# Patient Record
Sex: Female | Born: 1965
Health system: Southern US, Community
[De-identification: ages and names within clinical notes are randomized; demographics above are authoritative.]

---

## 2020-11-14 DIAGNOSIS — Z90710 Acquired absence of both cervix and uterus: Secondary | ICD-10-CM | POA: Diagnosis not present

## 2020-11-14 DIAGNOSIS — L659 Nonscarring hair loss, unspecified: Secondary | ICD-10-CM | POA: Diagnosis not present

## 2020-11-14 DIAGNOSIS — Z9079 Acquired absence of other genital organ(s): Secondary | ICD-10-CM | POA: Diagnosis not present

## 2020-11-14 DIAGNOSIS — Z1231 Encounter for screening mammogram for malignant neoplasm of breast: Secondary | ICD-10-CM | POA: Diagnosis not present

## 2020-11-29 DIAGNOSIS — L648 Other androgenic alopecia: Secondary | ICD-10-CM | POA: Diagnosis not present

## 2020-12-05 ENCOUNTER — Encounter: Payer: Self-pay | Admitting: Obstetrics and Gynecology

## 2020-12-09 ENCOUNTER — Encounter: Payer: Self-pay | Admitting: Obstetrics and Gynecology

## 2021-01-21 ENCOUNTER — Other Ambulatory Visit: Payer: Self-pay | Admitting: Obstetrics and Gynecology

## 2021-01-21 DIAGNOSIS — Z1231 Encounter for screening mammogram for malignant neoplasm of breast: Secondary | ICD-10-CM | POA: Diagnosis not present

## 2021-01-21 DIAGNOSIS — Z1272 Encounter for screening for malignant neoplasm of vagina: Secondary | ICD-10-CM | POA: Diagnosis not present

## 2021-01-21 DIAGNOSIS — Z1211 Encounter for screening for malignant neoplasm of colon: Secondary | ICD-10-CM | POA: Diagnosis not present

## 2021-01-21 DIAGNOSIS — Z01419 Encounter for gynecological examination (general) (routine) without abnormal findings: Secondary | ICD-10-CM | POA: Diagnosis not present

## 2021-01-24 DIAGNOSIS — Z01419 Encounter for gynecological examination (general) (routine) without abnormal findings: Secondary | ICD-10-CM | POA: Diagnosis not present

## 2021-02-13 ENCOUNTER — Ambulatory Visit
Admission: RE | Admit: 2021-02-13 | Discharge: 2021-02-13 | Disposition: A | Discharge status: Home / Self Care | Payer: BC Managed Care – PPO | Source: Ambulatory Visit | Attending: Obstetrics and Gynecology | Admitting: Obstetrics and Gynecology

## 2021-02-13 ENCOUNTER — Other Ambulatory Visit: Payer: Self-pay

## 2021-02-13 ENCOUNTER — Other Ambulatory Visit: Payer: Self-pay | Admitting: Obstetrics and Gynecology

## 2021-02-13 DIAGNOSIS — Z1231 Encounter for screening mammogram for malignant neoplasm of breast: Secondary | ICD-10-CM | POA: Insufficient documentation

## 2021-02-13 DIAGNOSIS — N644 Mastodynia: Secondary | ICD-10-CM

## 2021-03-03 ENCOUNTER — Other Ambulatory Visit: Payer: Self-pay

## 2021-03-03 ENCOUNTER — Ambulatory Visit
Admission: RE | Admit: 2021-03-03 | Discharge: 2021-03-03 | Disposition: A | Discharge status: Home / Self Care | Payer: BC Managed Care – PPO | Source: Ambulatory Visit | Attending: Obstetrics and Gynecology | Admitting: Obstetrics and Gynecology

## 2021-03-03 DIAGNOSIS — N644 Mastodynia: Secondary | ICD-10-CM | POA: Diagnosis not present

## 2021-03-03 DIAGNOSIS — R922 Inconclusive mammogram: Secondary | ICD-10-CM | POA: Diagnosis not present

## 2021-04-04 DIAGNOSIS — J0100 Acute maxillary sinusitis, unspecified: Secondary | ICD-10-CM | POA: Diagnosis not present

## 2021-07-09 DIAGNOSIS — J029 Acute pharyngitis, unspecified: Secondary | ICD-10-CM | POA: Diagnosis not present

## 2021-07-09 DIAGNOSIS — J209 Acute bronchitis, unspecified: Secondary | ICD-10-CM | POA: Diagnosis not present

## 2021-07-09 DIAGNOSIS — H66001 Acute suppurative otitis media without spontaneous rupture of ear drum, right ear: Secondary | ICD-10-CM | POA: Diagnosis not present

## 2021-07-09 DIAGNOSIS — Z03818 Encounter for observation for suspected exposure to other biological agents ruled out: Secondary | ICD-10-CM | POA: Diagnosis not present

## 2021-07-09 DIAGNOSIS — J019 Acute sinusitis, unspecified: Secondary | ICD-10-CM | POA: Diagnosis not present

## 2021-07-09 DIAGNOSIS — J0140 Acute pansinusitis, unspecified: Secondary | ICD-10-CM | POA: Diagnosis not present

## 2021-07-09 DIAGNOSIS — R112 Nausea with vomiting, unspecified: Secondary | ICD-10-CM | POA: Diagnosis not present

## 2021-07-09 DIAGNOSIS — Z209 Contact with and (suspected) exposure to unspecified communicable disease: Secondary | ICD-10-CM | POA: Diagnosis not present

## 2021-10-29 DIAGNOSIS — J0100 Acute maxillary sinusitis, unspecified: Secondary | ICD-10-CM | POA: Diagnosis not present

## 2021-10-29 DIAGNOSIS — R1011 Right upper quadrant pain: Secondary | ICD-10-CM | POA: Diagnosis not present

## 2022-04-27 ENCOUNTER — Telehealth: Payer: BC Managed Care – PPO | Admitting: Nurse Practitioner

## 2022-04-27 DIAGNOSIS — J40 Bronchitis, not specified as acute or chronic: Secondary | ICD-10-CM

## 2022-04-27 DIAGNOSIS — H1033 Unspecified acute conjunctivitis, bilateral: Secondary | ICD-10-CM

## 2022-04-27 MED ORDER — DOXYCYCLINE HYCLATE 100 MG PO TABS: 100.0000 mg | ORAL_TABLET | Freq: Two times a day (BID) | ORAL | 0 refills | Status: AC

## 2022-04-27 MED ORDER — ALBUTEROL SULFATE HFA 108 (90 BASE) MCG/ACT IN AERS: 2.0000 | INHALATION_SPRAY | Freq: Four times a day (QID) | RESPIRATORY_TRACT | 0 refills | Status: AC | PRN

## 2022-04-27 MED ORDER — POLYMYXIN B-TRIMETHOPRIM 10000-0.1 UNIT/ML-% OP SOLN: 1.0000 [drp] | OPHTHALMIC | 0 refills | Status: AC

## 2022-04-27 NOTE — Progress Notes (Signed)
We are sorry that you are not feeling well.  Here is how we plan to help!  Based on your presentation I believe you most likely have A cough due to bacteria.  When patients have a fever and a productive cough with a change in color or increased sputum production, we are concerned about bacterial bronchitis.  If left untreated it can progress to pneumonia.  If your symptoms do not improve with your treatment plan it is important that you contact your provider.   I have prescribed Doxycycline 100 mg twice a day for 10 days  This medication would also treat a sinus infection.    In addition you may use A non-prescription cough medication called Mucinex: take 2 tablets every 12 hours.  We will also send in a prescription for an inhaler to help with your cough.   Meds ordered this encounter  Medications   doxycycline (VIBRA-TABS) 100 MG tablet    Sig: Take 1 tablet (100 mg total) by mouth 2 (two) times daily for 10 days.    Dispense:  20 tablet    Refill:  0   albuterol (VENTOLIN HFA) 108 (90 Base) MCG/ACT inhaler    Sig: Inhale 2 puffs into the lungs every 6 (six) hours as needed for wheezing or shortness of breath.    Dispense:  8 g    Refill:  0     From your responses in the eVisit questionnaire you describe inflammation in the upper respiratory tract which is causing a significant cough.  This is commonly called Bronchitis and has four common causes:   Allergies Viral Infections Acid Reflux Bacterial Infection Allergies, viruses and acid reflux are treated by controlling symptoms or eliminating the cause. An example might be a cough caused by taking certain blood pressure medications. You stop the cough by changing the medication. Another example might be a cough caused by acid reflux. Controlling the reflux helps control the cough.  USE OF BRONCHODILATOR ("RESCUE") INHALERS: There is a risk from using your bronchodilator too frequently.  The risk is that over-reliance on a medication  which only relaxes the muscles surrounding the breathing tubes can reduce the effectiveness of medications prescribed to reduce swelling and congestion of the tubes themselves.  Although you feel brief relief from the bronchodilator inhaler, your asthma may actually be worsening with the tubes becoming more swollen and filled with mucus.  This can delay other crucial treatments, such as oral steroid medications. If you need to use a bronchodilator inhaler daily, several times per day, you should discuss this with your provider.  There are probably better treatments that could be used to keep your asthma under control.     HOME CARE Only take medications as instructed by your medical team. Complete the entire course of an antibiotic. Drink plenty of fluids and get plenty of rest. Avoid close contacts especially the very young and the elderly Cover your mouth if you cough or cough into your sleeve. Always remember to wash your hands A steam or ultrasonic humidifier can help congestion.   GET HELP RIGHT AWAY IF: You develop worsening fever. You become short of breath You cough up blood. Your symptoms persist after you have completed your treatment plan MAKE SURE YOU  Understand these instructions. Will watch your condition. Will get help right away if you are not doing well or get worse.    Thank you for choosing an e-visit.  Your e-visit answers were reviewed by a board certified advanced  clinical practitioner to complete your personal care plan. Depending upon the condition, your plan could have included both over the counter or prescription medications.  Please review your pharmacy choice. Make sure the pharmacy is open so you can pick up prescription now. If there is a problem, you may contact your provider through [employer] and have the prescription routed to another pharmacy.  Your safety is important to [place]. If you have drug allergies check your prescription carefully.   For  the next 24 hours you can use MyChart to ask questions about today's visit, request a non-urgent call back, or ask for a work or school excuse. You will get an email in the next two days asking about your experience. I hope that your e-visit has been valuable and will speed your recovery.  I spent approximately 5 minutes reviewing the patient's history, current symptoms and coordinating their care today.

## 2022-04-27 NOTE — Addendum Note (Signed)
Addended by: Madilyn Hook on: 04/27/2022 08:48 AM   Modules accepted: Orders

## 2022-04-27 NOTE — Progress Notes (Signed)
Sharee Pimple,  No problem here are the instructions for pink eye:  Based on what you have shared with me it looks like you have conjunctivitis.  Conjunctivitis is a common inflammatory or infectious condition of the eye that is often referred to as "pink eye".  In most cases it is contagious (viral or bacterial). However, not all conjunctivitis requires antibiotics (ex. Allergic).  We have made appropriate suggestions for you based upon your presentation.  I have prescribed Polytrim Ophthalmic drops 1-2 drops 4 times a day times 5 days  Pink eye can be highly contagious.  It is typically spread through direct contact with secretions, or contaminated objects or surfaces that one may have touched.  Strict handwashing is suggested with soap and water is urged.  If not available, use alcohol based had sanitizer.  Avoid unnecessary touching of the eye.  If you wear contact lenses, you will need to refrain from wearing them until you see no white discharge from the eye for at least 24 hours after being on medication.  You should see symptom improvement in 1-2 days after starting the medication regimen.  Call us if symptoms are not improved in 1-2 days.  Home Care: Wash your hands often! Do not wear your contacts until you complete your treatment plan. Avoid sharing towels, bed linen, personal items with a person who has pink eye. See attention for anyone in your home with similar symptoms.  Get Help Right Away If: Your symptoms do not improve. You develop blurred or loss of vision. Your symptoms worsen (increased discharge, pain or redness)

## 2022-05-19 ENCOUNTER — Other Ambulatory Visit: Payer: Self-pay | Admitting: Nurse Practitioner

## 2022-05-19 DIAGNOSIS — J40 Bronchitis, not specified as acute or chronic: Secondary | ICD-10-CM

## 2022-05-26 DIAGNOSIS — Z01419 Encounter for gynecological examination (general) (routine) without abnormal findings: Secondary | ICD-10-CM | POA: Diagnosis not present

## 2022-05-26 DIAGNOSIS — Z1211 Encounter for screening for malignant neoplasm of colon: Secondary | ICD-10-CM | POA: Diagnosis not present

## 2022-05-26 DIAGNOSIS — Z13220 Encounter for screening for lipoid disorders: Secondary | ICD-10-CM | POA: Diagnosis not present

## 2022-05-26 DIAGNOSIS — Z1231 Encounter for screening mammogram for malignant neoplasm of breast: Secondary | ICD-10-CM | POA: Diagnosis not present

## 2022-07-24 IMAGING — US US BREAST*L* LIMITED INC AXILLA
1 series · 6 of 6 positions shown · non-contrast
Comparison: Previous exam(s).

CLINICAL DATA: 54-year-old female with focal left breast pain for
3-4 months.

EXAM:
DIGITAL DIAGNOSTIC BILATERAL MAMMOGRAM WITH TOMOSYNTHESIS AND CAD;
ULTRASOUND LEFT BREAST LIMITED
TECHNIQUE: Bilateral digital diagnostic mammography and breast tomosynthesis
was performed. The images were evaluated with computer-aided
detection.; Targeted ultrasound examination of the left breast was
performed.

[Series 1: us breast*left* limited inc axilla · 0.08mm/px · 6 of 6 slices shown]
[im 1/6]
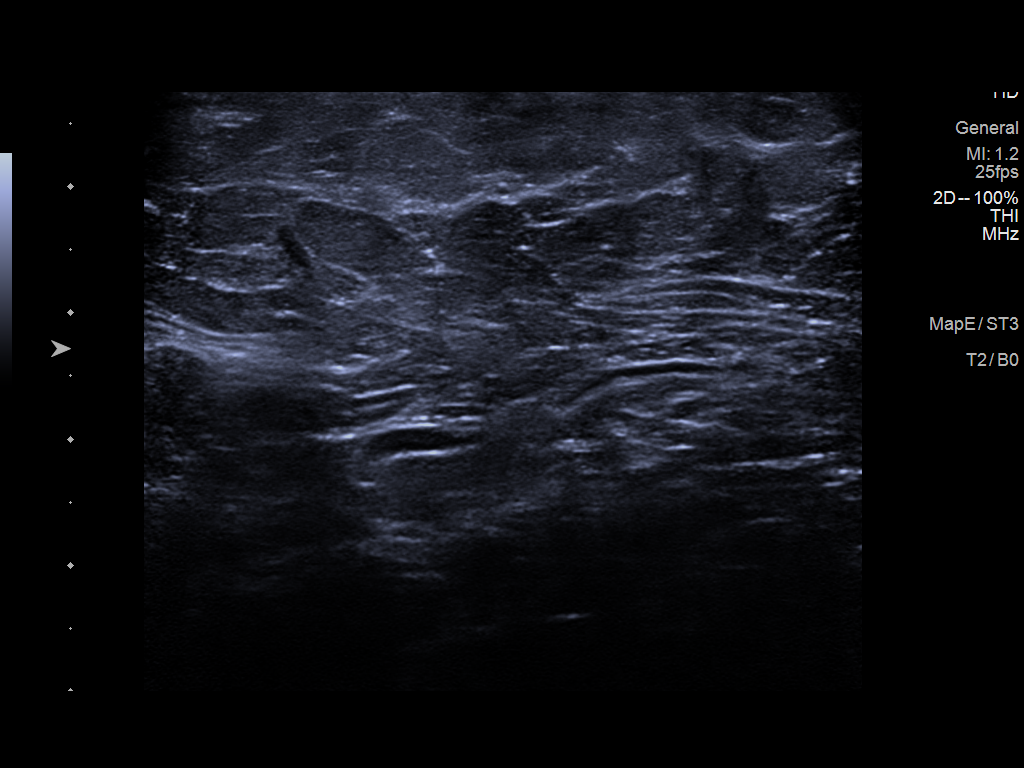
[im 2/6]
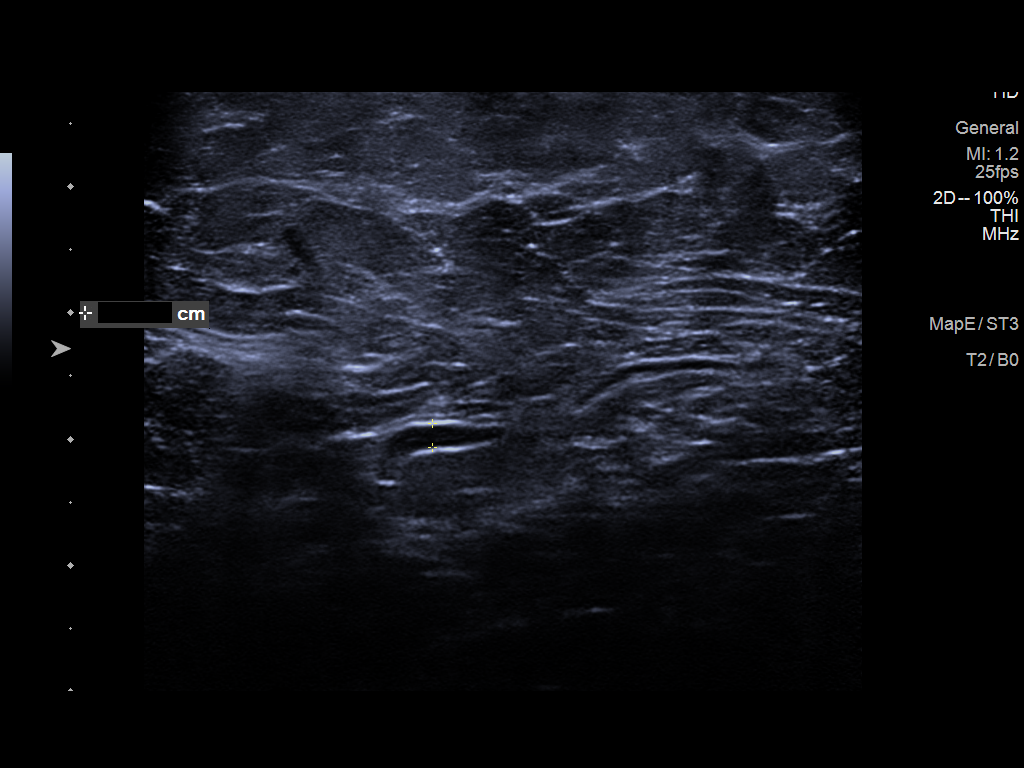
[im 3/6]
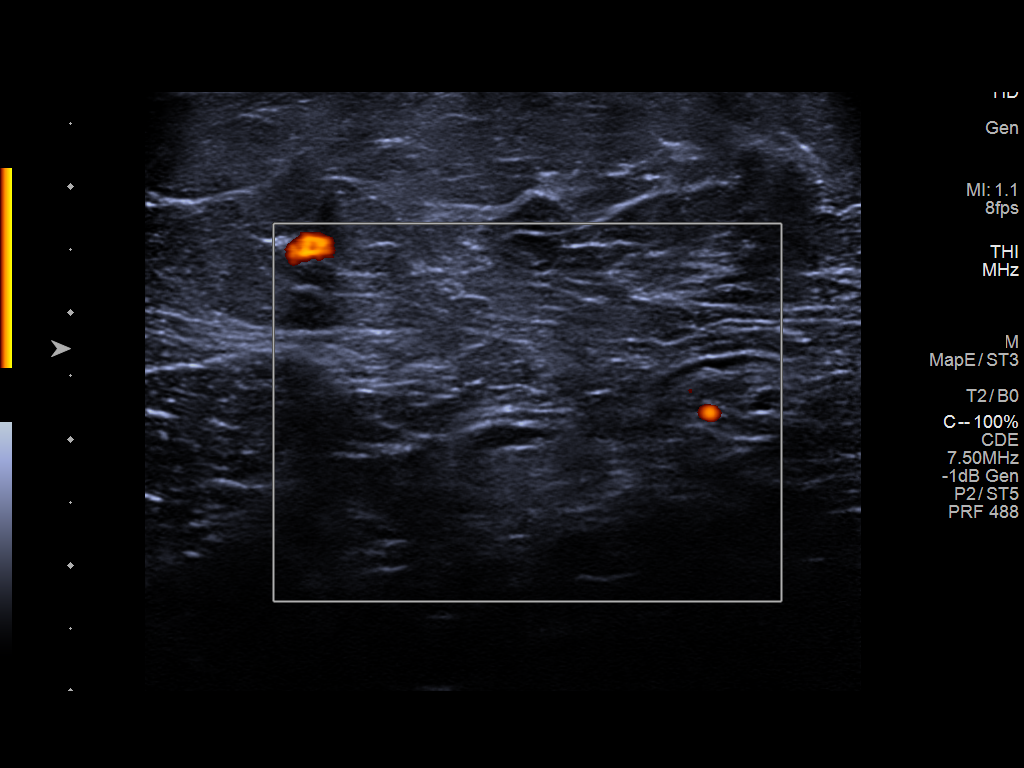
[im 4/6]
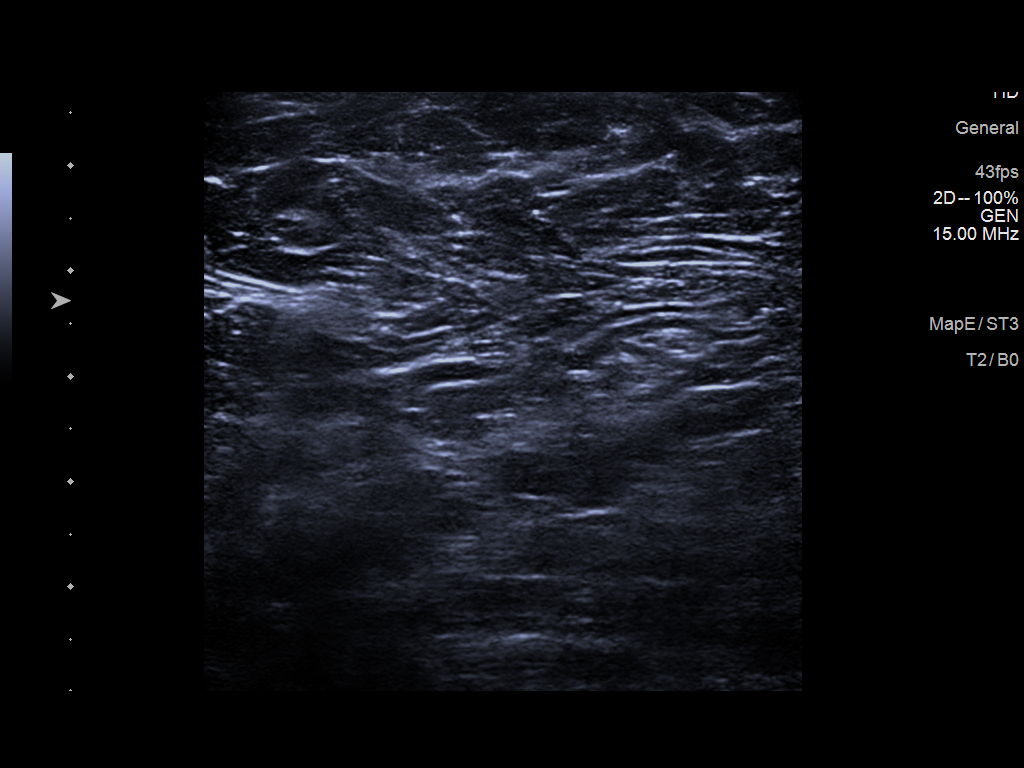
[im 5/6]
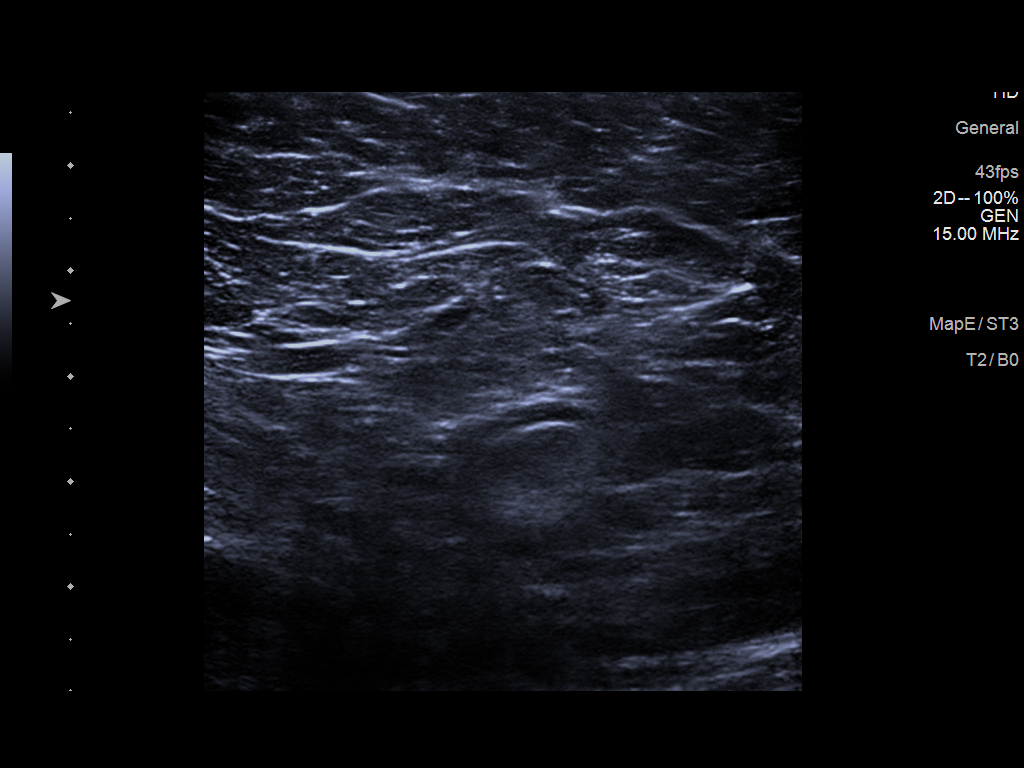
[im 6/6]
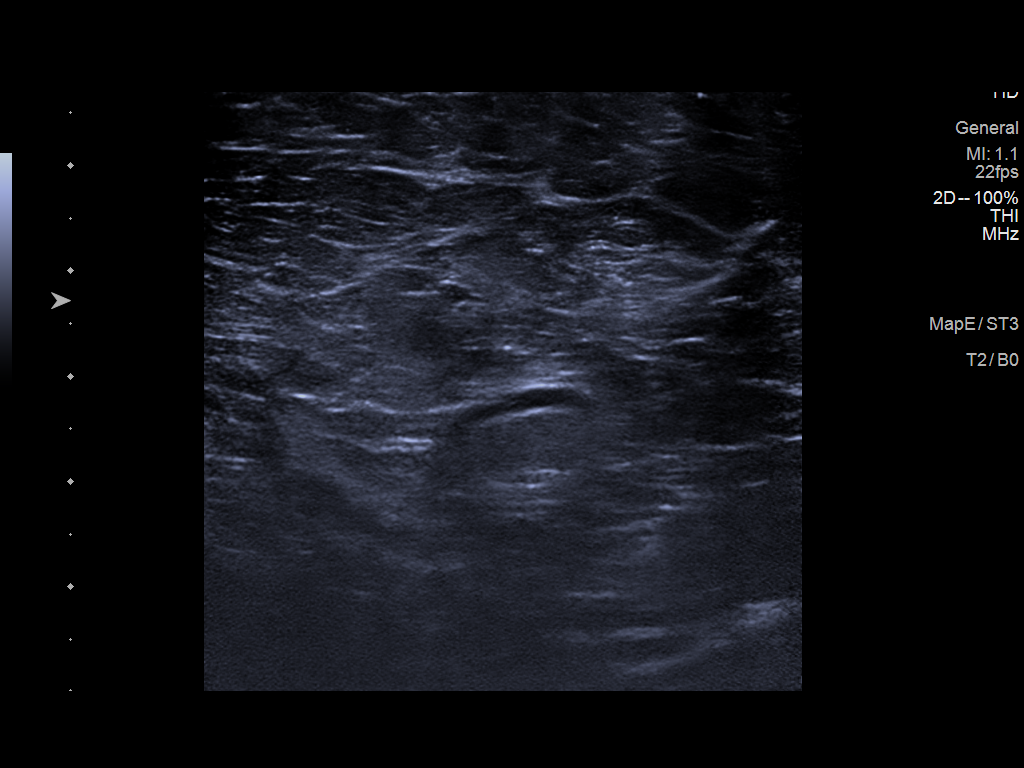

[6 of 6 positions shown; findings below may reference images not displayed]

ACR Breast Density Category b: There are scattered areas of
fibroglandular density.
FINDINGS: A radiopaque BB was placed at the site of the patient's focal pain
in the far upper outer left breast. No focal or suspicious
mammographic findings are seen deep to the radiopaque BB or within
the remainder of either breast. The parenchymal pattern is stable.

Targeted ultrasound is performed, showing normal fibroglandular
tissue without focal or suspicious sonographic abnormality in the
upper-outer left breast/low axilla.
IMPRESSION: 1. No mammographic evidence of malignancy in either breast.
2. Unremarkable ultrasound evaluation of the upper outer left
breast.

RECOMMENDATION:
1. Clinical follow-up recommended for the focally painful area of
concern in the left breast. Any further workup should be based on
clinical grounds.
2.  Screening mammogram in one year.(Code:[id number])

I have discussed the findings and recommendations with the patient.
If applicable, a reminder letter will be sent to the patient
regarding the next appointment.

BI-RADS CATEGORY  1: Negative.

## 2022-09-13 ENCOUNTER — Other Ambulatory Visit: Payer: Self-pay

## 2022-09-13 ENCOUNTER — Emergency Department
Admission: EM | Admit: 2022-09-13 | Discharge: 2022-09-13 | Disposition: A | Discharge status: Home / Self Care | Payer: BC Managed Care – PPO | Attending: Emergency Medicine | Admitting: Emergency Medicine

## 2022-09-13 ENCOUNTER — Emergency Department: Payer: BC Managed Care – PPO

## 2022-09-13 DIAGNOSIS — R197 Diarrhea, unspecified: Secondary | ICD-10-CM | POA: Diagnosis not present

## 2022-09-13 DIAGNOSIS — R1031 Right lower quadrant pain: Secondary | ICD-10-CM

## 2022-09-13 DIAGNOSIS — R112 Nausea with vomiting, unspecified: Secondary | ICD-10-CM | POA: Insufficient documentation

## 2022-09-13 LAB — CBC WITH DIFFERENTIAL/PLATELET
Abs Immature Granulocytes: 0.02 10*3/uL (ref 0.00–0.07)
Basophils Absolute: 0.1 10*3/uL (ref 0.0–0.1)
Basophils Relative: 1 %
Eosinophils Absolute: 0.7 10*3/uL — ABNORMAL HIGH (ref 0.0–0.5)
Eosinophils Relative: 6 %
HCT: 47 % — ABNORMAL HIGH (ref 36.0–46.0)
Hemoglobin: 16 g/dL — ABNORMAL HIGH (ref 12.0–15.0)
Immature Granulocytes: 0 %
Lymphocytes Relative: 33 %
Lymphs Abs: 3.5 10*3/uL (ref 0.7–4.0)
MCH: 31.4 pg (ref 26.0–34.0)
MCHC: 34 g/dL (ref 30.0–36.0)
MCV: 92.3 fL (ref 80.0–100.0)
Monocytes Absolute: 0.6 10*3/uL (ref 0.1–1.0)
Monocytes Relative: 6 %
Neutro Abs: 5.7 10*3/uL (ref 1.7–7.7)
Neutrophils Relative %: 54 %
Platelets: 372 10*3/uL (ref 150–400)
RBC: 5.09 MIL/uL (ref 3.87–5.11)
RDW: 11.5 % (ref 11.5–15.5)
WBC: 10.6 10*3/uL — ABNORMAL HIGH (ref 4.0–10.5)
nRBC: 0 % (ref 0.0–0.2)

## 2022-09-13 LAB — URINALYSIS, ROUTINE W REFLEX MICROSCOPIC
Bilirubin Urine: NEGATIVE
Glucose, UA: NEGATIVE mg/dL
Ketones, ur: NEGATIVE mg/dL
Leukocytes,Ua: NEGATIVE
Nitrite: NEGATIVE
Protein, ur: NEGATIVE mg/dL
Specific Gravity, Urine: 1.005 (ref 1.005–1.030)
pH: 5 (ref 5.0–8.0)

## 2022-09-13 LAB — COMPREHENSIVE METABOLIC PANEL
ALT: 22 U/L (ref 0–44)
AST: 17 U/L (ref 15–41)
Albumin: 4.9 g/dL (ref 3.5–5.0)
Alkaline Phosphatase: 59 U/L (ref 38–126)
Anion gap: 9 (ref 5–15)
BUN: 17 mg/dL (ref 6–20)
CO2: 24 mmol/L (ref 22–32)
Calcium: 9.7 mg/dL (ref 8.9–10.3)
Chloride: 105 mmol/L (ref 98–111)
Creatinine, Ser: 0.8 mg/dL (ref 0.44–1.00)
GFR, Estimated: >60 mL/min (ref >60)
Glucose, Bld: 87 mg/dL (ref 70–99)
Potassium: 4.2 mmol/L (ref 3.5–5.1)
Sodium: 138 mmol/L (ref 135–145)
Total Bilirubin: 0.7 mg/dL (ref 0.3–1.2)
Total Protein: 8 g/dL (ref 6.5–8.1)

## 2022-09-13 LAB — LIPASE, BLOOD: Lipase: 67 U/L — ABNORMAL HIGH (ref 11–51)

## 2022-09-13 MED ORDER — IOHEXOL 300 MG/ML  SOLN
100.0000 mL | Freq: Once | INTRAMUSCULAR | Status: AC | PRN
  Administered 2022-09-13: 100 mL via INTRAVENOUS

## 2022-09-13 NOTE — ED Provider Notes (Signed)
[facility] Provider Note    Event Date/Time   First MD Initiated Contact with Patient 09/13/22 1710     (approximate)   History   Abdominal Pain (RLQ pain x 10 days, associated with nausea, vomiting, diarrhea)   HPI  Barbara Dudley is a 57 y.o. female past medical history of TAH and BSO for ovarian cyst who presents with a week and a half of right lower quadrant pain.  Pain comes and goes.  No clear exacerbating or alleviating factors.  It is located in the right lower quadrant and does not move around.  Has had overall decreased appetite but is still eating.  Has had 3 episodes of emesis over the last week.  Stools somewhat looser but no frank diarrhea.  No fevers chills.  Patient tells me for about a months she is just felt more fatigued and has had about a 5 pound weight loss.  No urinary symptoms.     No past medical history on file.  There are no problems to display for this patient.    Physical Exam  Triage Vital Signs: ED Triage Vitals  Enc Vitals Group     BP 09/13/22 1617 104/75     Pulse Rate 09/13/22 1617 74     Resp 09/13/22 1617 18     Temp 09/13/22 1617 98.7 F (37.1 C)     Temp Source 09/13/22 1617 Oral     SpO2 09/13/22 1617 96 %     Weight 09/13/22 1618 168 lb (76.2 kg)     Height 09/13/22 1618 5' (1.524 m)     Head Circumference --      Peak Flow --      Pain Score --      Pain Loc --      Pain Edu? --      Excl. in GC? --     Most recent vital signs: Vitals:   09/13/22 1617  BP: 104/75  Pulse: 74  Resp: 18  Temp: 98.7 F (37.1 C)  SpO2: 96%     General: Awake, no distress.  CV:  Good peripheral perfusion.  Resp:  Normal effort.  Abd:  No distention.  Abdomen is soft, mild tenderness in the right lower quadrant no guarding Neuro:             Awake, Alert, Oriented x 3  Other:     ED Results / Procedures / Treatments  Labs (all labs ordered are listed, but only abnormal results are displayed) Labs Reviewed   CBC WITH DIFFERENTIAL/PLATELET - Abnormal; Notable for the following components:      Result Value   WBC 10.6 (*)    Hemoglobin 16.0 (*)    HCT 47.0 (*)    Eosinophils Absolute 0.7 (*)    All other components within normal limits  LIPASE, BLOOD - Abnormal; Notable for the following components:   Lipase 67 (*)    All other components within normal limits  URINALYSIS, ROUTINE W REFLEX MICROSCOPIC - Abnormal; Notable for the following components:   Color, Urine STRAW (*)    APPearance CLEAR (*)    Hgb urine dipstick MODERATE (*)    Bacteria, UA RARE (*)    All other components within normal limits  COMPREHENSIVE METABOLIC PANEL     EKG     RADIOLOGY I reviewed interpreted patient CT of the abdomen pelvis which is negative for acute pathology   PROCEDURES:  Critical Care performed: No  Procedures  MEDICATIONS ORDERED IN ED: Medications  iohexol (OMNIPAQUE) 300 MG/ML solution 100 mL (100 mLs Intravenous Contrast Given 09/13/22 1843)     IMPRESSION / MDM / ASSESSMENT AND PLAN / ED COURSE  I reviewed the triage vital signs and the nursing notes.                              Patient's presentation is most consistent with acute complicated illness / injury requiring diagnostic workup.  Differential diagnosis includes, but is not limited to, appendicitis, pyelonephritis, kidney stone, hernia, musculoskeletal pain  The patient is a 57 year old female who presents with about a week and a half of intermittent right lower quadrant abdominal pain associated with nausea vomiting decreased appetite and loose stool.  Patient was sent over from [facility] clinic for evaluation of appendicitis.   Patient's vital signs are reassuring.  She tells me she has mild pain currently.  Abdominal exam overall is quite benign abdomen is soft but she does have tenderness located in the right lower quadrant.  Overall given a week and a half of symptoms are intermittent with benign exam my  suspicion for appendicitis is quite low but do not have another great explanation for her pain.  Will proceed with CT of the abdomen pelvis with contrast.  Patient not requiring pain medicine at this time.  Labs and urinalysis are pending.  Patient's labs are overall reassuring she has a slight leukocytosis.  Urinalysis is not consistent with UTI.  Normal CMP.  The abdomen pelvis has no obvious etiology of patient's pain.  Given benign exam and chronicity of symptoms do not feel that she needs further workup at this time.  Discussed NSAID and Tylenol for pain.  We discussed return precautions and PCP follow-up.        FINAL CLINICAL IMPRESSION(S) / ED DIAGNOSES   Final diagnoses:  RLQ abdominal pain     Rx / DC Orders   ED Discharge Orders     None        Note:  This document was prepared using Dragon voice recognition software and may include unintentional dictation errors.   Georga Hacking, MD 09/13/22 [phone]

## 2022-09-13 NOTE — ED Triage Notes (Signed)
Sent from [facility] - note from provider "57 year old female comes in today with reports of right lower quadrant abdominal pain over the past 10 days, associated with nausea, vomiting, diarrhea. Symptoms gradually worsening per patient, states that her husband is worried about her potentially having appendicitis. Patient with mild to moderate tenderness to palpation in right lower quadrant. History of TAH/BSO. Pain feels similar to when she would have "ovarian cyst". To ER for further evaluation and management, imaging; also lack of availability of stat CMP here at [facility] on weekends "

## 2022-09-13 NOTE — Discharge Instructions (Addendum)
Your CAT scan and blood work was all reassuring.  We do not know exactly what is causing your pain.  You can take ibuprofen and use a heating pad.  If symptoms are worsening then please return to the emergency department.  If symptoms or not improving please follow-up with your primary care doctor.

## 2023-02-01 ENCOUNTER — Encounter: Admission: RE | Payer: Self-pay | Source: Home / Self Care

## 2023-02-01 ENCOUNTER — Ambulatory Visit: Admission: RE | Admit: 2023-02-01 | Payer: BC Managed Care – PPO | Source: Home / Self Care

## 2023-02-01 SURGERY — COLONOSCOPY WITH PROPOFOL
Anesthesia: General

## 2024-02-16 ENCOUNTER — Other Ambulatory Visit: Payer: Self-pay | Admitting: Obstetrics and Gynecology

## 2024-02-16 ENCOUNTER — Encounter: Payer: Self-pay | Admitting: *Deleted

## 2024-02-16 DIAGNOSIS — Z1231 Encounter for screening mammogram for malignant neoplasm of breast: Secondary | ICD-10-CM

## 2024-04-28 ENCOUNTER — Encounter: Admission: RE | Payer: Self-pay | Source: Home / Self Care

## 2024-04-28 ENCOUNTER — Ambulatory Visit: Admission: RE | Admit: 2024-04-28 | Source: Home / Self Care | Admitting: Gastroenterology
